# Patient Record
Sex: Male | Born: 1965 | Race: White | Hispanic: Yes | Marital: Single | Smoking: Never smoker
Health system: Southern US, Community
[De-identification: ages and names within clinical notes are randomized; demographics above are authoritative.]

## PROBLEM LIST (undated history)

## (undated) DIAGNOSIS — F329 Major depressive disorder, single episode, unspecified: Secondary | ICD-10-CM

## (undated) DIAGNOSIS — F32A Depression, unspecified: Secondary | ICD-10-CM

## (undated) DIAGNOSIS — E7800 Pure hypercholesterolemia, unspecified: Secondary | ICD-10-CM

---

## 2005-07-31 ENCOUNTER — Emergency Department (HOSPITAL_COMMUNITY): Admission: EM | Admit: 2005-07-31 | Discharge: 2005-07-31 | Payer: Self-pay | Admitting: Emergency Medicine

## 2005-08-03 ENCOUNTER — Emergency Department (HOSPITAL_COMMUNITY): Admission: EM | Admit: 2005-08-03 | Discharge: 2005-08-03 | Payer: Self-pay | Admitting: Emergency Medicine

## 2005-08-16 ENCOUNTER — Emergency Department (HOSPITAL_COMMUNITY): Admission: EM | Admit: 2005-08-16 | Discharge: 2005-08-16 | Payer: Self-pay | Admitting: Emergency Medicine

## 2012-04-19 ENCOUNTER — Emergency Department (HOSPITAL_COMMUNITY)
Admission: EM | Admit: 2012-04-19 | Discharge: 2012-04-19 | Disposition: A | Discharge status: Home / Self Care | Payer: Worker's Compensation | Attending: Emergency Medicine | Admitting: Emergency Medicine

## 2012-04-19 DIAGNOSIS — M545 Low back pain, unspecified: Secondary | ICD-10-CM | POA: Insufficient documentation

## 2012-04-19 DIAGNOSIS — R209 Unspecified disturbances of skin sensation: Secondary | ICD-10-CM | POA: Insufficient documentation

## 2012-04-19 DIAGNOSIS — X503XXA Overexertion from repetitive movements, initial encounter: Secondary | ICD-10-CM | POA: Insufficient documentation

## 2012-04-19 DIAGNOSIS — Y990 Civilian activity done for income or pay: Secondary | ICD-10-CM | POA: Insufficient documentation

## 2012-04-19 DIAGNOSIS — Y9269 Other specified industrial and construction area as the place of occurrence of the external cause: Secondary | ICD-10-CM | POA: Insufficient documentation

## 2012-04-19 DIAGNOSIS — Y93K9 Activity, other involving animal care: Secondary | ICD-10-CM | POA: Insufficient documentation

## 2012-04-19 MED ORDER — HYDROCODONE-ACETAMINOPHEN 5-325 MG PO TABS: ORAL_TABLET | ORAL | Status: AC

## 2012-04-19 MED ORDER — IBUPROFEN 600 MG PO TABS: 600.0000 mg | ORAL_TABLET | Freq: Four times a day (QID) | ORAL | Status: AC | PRN

## 2012-04-19 MED ORDER — METHOCARBAMOL 500 MG PO TABS: 1000.0000 mg | ORAL_TABLET | Freq: Four times a day (QID) | ORAL | Status: AC

## 2012-04-19 NOTE — ED Notes (Signed)
Pt states he has lower back pain from a prior injury. Pt states he was moving a 90 lb dog at work and injured his back awhile ago and pain is getting worse. Pt states he has taken Advil for pain, but it is not helping and it's hurting his stomach. Pt also states pain is in L buttock and radiating down L leg. States he feels numb in L leg. Pt ambulatory to exam room with steady gait. Pt states he drove himself here.

## 2012-04-19 NOTE — ED Provider Notes (Signed)
History    This chart was scribed for non-physician practitioner working with Ward Givens, MD by Leone Payor, ED Scribe. This patient was seen in room WTR6/WTR6 and the patient's care was started at 2001.   CSN: [id number]  Arrival date & time 04/19/12  2001   First MD Initiated Contact with Patient 04/19/12 2016      Chief Complaint  Patient presents with  . Back Pain    The history is provided by the patient. No language interpreter was used.    Jose Cantu is a 47 y.o. male who presents to the Emergency Department complaining of ongoing, constant, gradually worsening low back pain that radiates to left buttocks starting 1-2 weeks ago. Pt describes the discomfort as numbness and rates the pain as 5/10 to 7/10. States the pain is worse with walking. 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 He has had pain since that incident. He reports taking Advil but states it is causing irriation in his stomach and while urinating. Pt denies having h/o back problems. He denies fever, sudden weight change, IV drug use, h/o cancer, loss of bowel or bladder function.    PCP [facility] in [place].   No significant past medical history.  No significant past surgical history.  No family history on file.  History  Substance Use Topics  . Smoking status: Not on file  . Smokeless tobacco: Not on file  . Alcohol Use: Not on file      Review of Systems  Constitutional: Negative for fever and unexpected weight change.  Gastrointestinal: Negative for constipation.       Neg for fecal incontinence  Genitourinary: Negative for hematuria, flank pain and difficulty urinating.       Negative for urinary incontinence or retention  Musculoskeletal: Positive for back pain.  Neurological: Negative for weakness and numbness.       Negative for saddle paresthesias     Allergies  Review of patient's allergies indicates not on file.  Home  Medications   Current Outpatient Rx  Name  Route  Sig  Dispense  Refill  . HYDROcodone-acetaminophen (NORCO/VICODIN) 5-325 MG per tablet      Take 1-2 tablets every 6 hours as needed for severe pain   12 tablet   0   . ibuprofen (ADVIL,MOTRIN) 600 MG tablet   Oral   Take 1 tablet (600 mg total) by mouth every 6 (six) hours as needed for pain.   20 tablet   0   . methocarbamol (ROBAXIN) 500 MG tablet   Oral   Take 2 tablets (1,000 mg total) by mouth 4 (four) times daily.   20 tablet   0     BP 127/77  Pulse 82  Temp(Src) 98.4 F (36.9 C) (Oral)  Resp 16  SpO2 97%  Physical Exam  Nursing note and vitals reviewed. Constitutional: He appears well-developed and well-nourished. No distress.  HENT:  Head: Normocephalic and atraumatic.  Eyes: Conjunctivae and EOM are normal.  Neck: Normal range of motion. Neck supple. No tracheal deviation present.  Cardiovascular: Normal rate.   Pulmonary/Chest: Effort normal. No respiratory distress.  Abdominal: Soft. There is no tenderness. There is no CVA tenderness.  Musculoskeletal: Normal range of motion. He exhibits no tenderness.  Lumbar paraspinal tenderness. Trace weakness of LLE in the hip, of the knee, ankle. Decreased sensation on medial left thigh.   Neurological: He is alert. He has  normal reflexes. No sensory deficit. He exhibits normal muscle tone.  Skin: Skin is warm and dry.  Psychiatric: He has a normal mood and affect. His behavior is normal.    ED Course  Procedures (including critical care time)  DIAGNOSTIC STUDIES: Oxygen Saturation is 97% on room air, adequate by my interpretation.    COORDINATION OF CARE: 8:24 PM Discussed treatment plan with pt at bedside and pt agreed to plan.    Labs Reviewed - No data to display No results found.   1. Low back pain     Patient seen and examined.  Vital signs reviewed and are as follows: Filed Vitals:   04/19/12 2025  BP: 127/77  Pulse: 82  Temp: 98.4 F  (36.9 C)  Resp: 16    No red flag s/s of low back pain. Patient was counseled on back pain precautions and told to do activity as tolerated but do not lift, push, or pull heavy objects more than 10 pounds for the next week.  Patient counseled to use ice or heat on back for no longer than 15 minutes every hour.   Patient prescribed muscle relaxer and counseled on proper use of muscle relaxant medication.    Patient prescribed narcotic pain medicine and counseled on proper use of narcotic pain medications. Counseled not to combine this medication with others containing tylenol.   Urged patient not to drink alcohol, drive, or perform any other activities that requires focus while taking either of these medications.  Patient urged to follow-up with PCP if pain does not improve with treatment and rest or if pain becomes recurrent. Urged to return with worsening severe pain, loss of bowel or bladder control, trouble walking.   The patient verbalizes understanding and agrees with the plan.     MDM  Patient with back pain. Slight weakness in left lower extremity. Suspect patient has radicular symptoms. Patient is ambulatory. No warning symptoms of back pain including: loss of bowel or bladder control, night sweats, waking from sleep with back pain, unexplained fevers or weight loss, h/o cancer, IVDU, recent trauma. No concern for cauda equina, epidural abscess, or other serious cause of back pain. No indication for emergent imaging or neurosurgical consultation. Conservative measures such as rest, ice/heat and pain medicine indicated with PCP follow-up if no improvement with conservative management.   I personally performed the services described in this documentation, which was scribed in my presence. The recorded information has been reviewed and is accurate.       Renne Crigler, PA-C 04/20/12 0045

## 2012-04-22 NOTE — ED Provider Notes (Signed)
Medical screening examination/treatment/procedure(s) were performed by non-physician practitioner and as supervising physician I was immediately available for consultation/collaboration. Taraji Mungo, MD, FACEP   Jshon Ibe L Teghan Philbin, MD 04/22/12 0859 

## 2015-09-20 ENCOUNTER — Emergency Department (HOSPITAL_BASED_OUTPATIENT_CLINIC_OR_DEPARTMENT_OTHER)
Admission: EM | Admit: 2015-09-20 | Discharge: 2015-09-20 | Disposition: A | Discharge status: Home / Self Care | Payer: Worker's Compensation | Attending: Emergency Medicine | Admitting: Emergency Medicine

## 2015-09-20 ENCOUNTER — Encounter (HOSPITAL_BASED_OUTPATIENT_CLINIC_OR_DEPARTMENT_OTHER): Payer: Self-pay | Admitting: Emergency Medicine

## 2015-09-20 DIAGNOSIS — Y929 Unspecified place or not applicable: Secondary | ICD-10-CM | POA: Insufficient documentation

## 2015-09-20 DIAGNOSIS — S0990XA Unspecified injury of head, initial encounter: Secondary | ICD-10-CM

## 2015-09-20 DIAGNOSIS — Y990 Civilian activity done for income or pay: Secondary | ICD-10-CM | POA: Diagnosis not present

## 2015-09-20 DIAGNOSIS — Y939 Activity, unspecified: Secondary | ICD-10-CM | POA: Diagnosis not present

## 2015-09-20 DIAGNOSIS — S0101XA Laceration without foreign body of scalp, initial encounter: Secondary | ICD-10-CM | POA: Insufficient documentation

## 2015-09-20 DIAGNOSIS — W228XXA Striking against or struck by other objects, initial encounter: Secondary | ICD-10-CM | POA: Diagnosis not present

## 2015-09-20 DIAGNOSIS — Z23 Encounter for immunization: Secondary | ICD-10-CM | POA: Insufficient documentation

## 2015-09-20 DIAGNOSIS — Z79899 Other long term (current) drug therapy: Secondary | ICD-10-CM | POA: Insufficient documentation

## 2015-09-20 HISTORY — DX: Depression, unspecified: F32.A

## 2015-09-20 HISTORY — DX: Pure hypercholesterolemia, unspecified: E78.00

## 2015-09-20 HISTORY — DX: Major depressive disorder, single episode, unspecified: F32.9

## 2015-09-20 MED ORDER — TETANUS-DIPHTH-ACELL PERTUSSIS 5-2.5-18.5 LF-MCG/0.5 IM SUSP
0.5000 mL | Freq: Once | INTRAMUSCULAR | Status: AC
  Administered 2015-09-20: 0.5 mL via INTRAMUSCULAR
  Filled 2015-09-20: qty 0.5
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
## 2015-11-10 ENCOUNTER — Emergency Department (HOSPITAL_COMMUNITY)
Admission: EM | Admit: 2015-11-10 | Discharge: 2015-11-10 | Disposition: A | Discharge status: Home / Self Care | Payer: BLUE CROSS/BLUE SHIELD | Attending: Emergency Medicine | Admitting: Emergency Medicine

## 2015-11-10 ENCOUNTER — Emergency Department (HOSPITAL_COMMUNITY): Payer: BLUE CROSS/BLUE SHIELD

## 2015-11-10 DIAGNOSIS — Y999 Unspecified external cause status: Secondary | ICD-10-CM | POA: Diagnosis not present

## 2015-11-10 DIAGNOSIS — S29021A Laceration of muscle and tendon of front wall of thorax, initial encounter: Secondary | ICD-10-CM | POA: Insufficient documentation

## 2015-11-10 DIAGNOSIS — Y939 Activity, unspecified: Secondary | ICD-10-CM | POA: Insufficient documentation

## 2015-11-10 DIAGNOSIS — T148XXA Other injury of unspecified body region, initial encounter: Secondary | ICD-10-CM

## 2015-11-10 DIAGNOSIS — Y9289 Other specified places as the place of occurrence of the external cause: Secondary | ICD-10-CM | POA: Insufficient documentation

## 2015-11-10 DIAGNOSIS — S21111A Laceration without foreign body of right front wall of thorax without penetration into thoracic cavity, initial encounter: Secondary | ICD-10-CM

## 2015-11-10 LAB — PREPARE FRESH FROZEN PLASMA
UNIT DIVISION: 0
Unit division: 0

## 2015-11-10 LAB — ABO/RH: ABO/RH(D): O POS

## 2015-11-10 LAB — I-STAT CHEM 8, ED
BUN: 22 mg/dL — ABNORMAL HIGH (ref 6–20)
Calcium, Ion: 1.09 mmol/L — ABNORMAL LOW (ref 1.15–1.40)
Chloride: 106 mmol/L (ref 101–111)
Creatinine, Ser: 0.9 mg/dL (ref 0.61–1.24)
GLUCOSE: 142 mg/dL — AB (ref 65–99)
HEMATOCRIT: 44 % (ref 39.0–52.0)
HEMOGLOBIN: 15 g/dL (ref 13.0–17.0)
POTASSIUM: 3.7 mmol/L (ref 3.5–5.1)
Sodium: 139 mmol/L (ref 135–145)
TCO2: 24 mmol/L (ref 0–100)

## 2015-11-10 LAB — I-STAT TROPONIN, ED: Troponin i, poc: 0 ng/mL (ref 0.00–0.08)

## 2015-11-10 LAB — I-STAT CG4 LACTIC ACID, ED: LACTIC ACID, VENOUS: 2.59 mmol/L — AB (ref 0.5–1.9)

## 2015-11-10 MED ORDER — LIDOCAINE-EPINEPHRINE 1 %-1:100000 IJ SOLN
20.0000 mL | Freq: Once | INTRAMUSCULAR | Status: AC
  Administered 2015-11-10: 20 mL via INTRADERMAL
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
## 2015-11-10 NOTE — ED Notes (Signed)
Dr. Mayford KnifeWilliams at bedside to suture laceration
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
## 2015-11-11 LAB — TYPE AND SCREEN
ABO/RH(D): O POS
ANTIBODY SCREEN: NEGATIVE
Unit division: 0
Unit division: 0

## 2016-10-02 ENCOUNTER — Emergency Department (HOSPITAL_BASED_OUTPATIENT_CLINIC_OR_DEPARTMENT_OTHER)
Admission: EM | Admit: 2016-10-02 | Discharge: 2016-10-02 | Disposition: A | Discharge status: Home / Self Care | Payer: Worker's Compensation | Attending: Emergency Medicine | Admitting: Emergency Medicine

## 2016-10-02 ENCOUNTER — Encounter (HOSPITAL_BASED_OUTPATIENT_CLINIC_OR_DEPARTMENT_OTHER): Payer: Self-pay | Admitting: Emergency Medicine

## 2016-10-02 DIAGNOSIS — X501XXA Overexertion from prolonged static or awkward postures, initial encounter: Secondary | ICD-10-CM | POA: Diagnosis not present

## 2016-10-02 DIAGNOSIS — Y9259 Other trade areas as the place of occurrence of the external cause: Secondary | ICD-10-CM | POA: Diagnosis not present

## 2016-10-02 DIAGNOSIS — S39012A Strain of muscle, fascia and tendon of lower back, initial encounter: Secondary | ICD-10-CM | POA: Diagnosis not present

## 2016-10-02 DIAGNOSIS — Y999 Unspecified external cause status: Secondary | ICD-10-CM | POA: Insufficient documentation

## 2016-10-02 DIAGNOSIS — Y93K9 Activity, other involving animal care: Secondary | ICD-10-CM | POA: Diagnosis not present

## 2016-10-02 DIAGNOSIS — Z79899 Other long term (current) drug therapy: Secondary | ICD-10-CM | POA: Insufficient documentation

## 2016-10-02 DIAGNOSIS — M545 Low back pain: Secondary | ICD-10-CM | POA: Diagnosis present

## 2016-10-02 MED ORDER — ORPHENADRINE CITRATE ER 100 MG PO TB12: 100.0000 mg | ORAL_TABLET | Freq: Two times a day (BID) | ORAL | 0 refills | Status: DC

## 2016-10-02 MED ORDER — PREDNISONE 20 MG PO TABS: ORAL_TABLET | ORAL | 0 refills | Status: DC

## 2016-10-02 MED ORDER — TRAMADOL HCL 50 MG PO TABS: 100.0000 mg | ORAL_TABLET | Freq: Four times a day (QID) | ORAL | 0 refills | Status: DC | PRN

## 2016-10-02 NOTE — ED Triage Notes (Signed)
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 Reports that pain is to his lower back and radiates down his leg

## 2016-10-02 NOTE — ED Notes (Signed)
Pt educated about not driving or performing critical tasks (such as operating heavy machinery or caring for an infant/toddler/child) while taking Ultram/Norflex. Educated about not mixing medications with alcohol and/or other sedatives (such as narcotics, Benadryl). Pt/caregiver verbalized understanding.

## 2016-10-02 NOTE — ED Provider Notes (Signed)
[facility] Provider Note   CSN: [id number] Arrival date & time: 10/02/16  1446     History   Chief Complaint Chief Complaint  Patient presents with  . Back Pain    HPI Jose Cantu is a 51 y.o. male.  HPI Patient has had problems with back strain in the past. 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 The dog lunged causing him to have to strain backwards to support the animal. He felt a pull and sharp pain in his left mid to lower back. He reports now he gets a pain that radiates from the upper lumbar area to the buttocks on the left. He reports it causes some slight discomfort into his leg. He is able to walk with a mild limp. No numbness or tingling of the leg. No associated abdominal pain. Patient reports he has similar episode about 2 years ago. He reports he does have a distant history of ulcers and is not supposed to take naproxen. He used to be on a PPI but has not been taking one for a while. No history of diabetes. Past Medical History:  Diagnosis Date  . Depression   . Hypercholesterolemia     There are no active problems to display for this patient.   History reviewed. No pertinent surgical history.     Home Medications    Prior to Admission medications   Medication Sig Start Date End Date Taking? Authorizing Provider  busPIRone (BUSPAR) 10 MG tablet Take 10 mg by mouth 3 (three) times daily.    [provider]  citalopram (CELEXA) 10 MG tablet Take 10 mg by mouth daily.    [provider]  HYDROcodone-acetaminophen (NORCO/VICODIN) 5-325 MG per tablet Take 1-2 tablets every 6 hours as needed for severe pain 04/19/12   Renne Crigler, PA-C  ibuprofen (ADVIL,MOTRIN) 600 MG tablet Take 1 tablet (600 mg total) by mouth every 6 (six) hours as needed for pain. 04/19/12   Renne Crigler, PA-C  methocarbamol (ROBAXIN) 500 MG tablet Take 2 tablets (1,000 mg total) by mouth 4 (four) times daily. 04/19/12    Renne Crigler, PA-C  orphenadrine (NORFLEX) 100 MG tablet Take 1 tablet (100 mg total) by mouth 2 (two) times daily. 10/02/16   Arby Barrette, MD  predniSONE (DELTASONE) 20 MG tablet 3 tabs po day one, then 2 tabs daily x 4 days 10/02/16   Arby Barrette, MD  simvastatin (ZOCOR) 20 MG tablet Take 20 mg by mouth daily.    [provider]  traMADol (ULTRAM) 50 MG tablet Take 2 tablets (100 mg total) by mouth every 6 (six) hours as needed. 10/02/16   Arby Barrette, MD    Family History History reviewed. No pertinent family history.  Social History Social History  Substance Use Topics  . Smoking status: Never Smoker  . Smokeless tobacco: Never Used  . Alcohol use No     Allergies   Patient has no known allergies.   Review of Systems Review of Systems 10 Systems reviewed and are negative for acute change except as noted in the HPI.   Physical Exam Updated Vital Signs BP 113/87 (BP Location: Right Arm)   Pulse 76   Resp 18   Ht  (1.651 m)   Wt 90.7 kg (200 lb)   SpO2 96%   BMI 33.28 kg/m   Physical Exam  Constitutional: He is oriented to person, place, and time. He appears well-developed and well-nourished.  Alert and nontoxic. Patient is standing and ambulating in the room. Moderate central obesity.  HENT:  Head: Normocephalic and atraumatic.  Eyes: Conjunctivae and EOM are normal.  Neck: Neck supple.  Cardiovascular: Normal rate and regular rhythm.   No murmur heard. Pulmonary/Chest: Effort normal and breath sounds normal. No respiratory distress.  Musculoskeletal: Normal range of motion. He exhibits tenderness.  Patient has some reproducible tenderness in the left paraspinous lumbar back from approximately L2-3 down to the SI region. He is able to ambulate with normal gait. No lower extremity weakness.  Neurological: He is alert and oriented to person, place, and time. No cranial nerve deficit. He exhibits normal muscle tone. Coordination normal.  Skin:  Skin is warm and dry.  Psychiatric: He has a normal mood and affect.  Nursing note and vitals reviewed.    ED Treatments / Results  Labs (all labs ordered are listed, but only abnormal results are displayed) Labs Reviewed - No data to display  EKG  EKG Interpretation None       Radiology No results found.  Procedures Procedures (including critical care time)  Medications Ordered in ED Medications - No data to display   Initial Impression / Assessment and Plan / ED Course  I have reviewed the triage vital signs and the nursing notes.  Pertinent labs & imaging results that were available during my care of the patient were reviewed by me and considered in my medical decision making (see chart for details).     Final Clinical Impressions(s) / ED Diagnoses   Final diagnoses:  Lumbar strain, initial encounter   Patient had an injury at work today resulting in lumbar strain. There are some radicular symptoms with pain radiation to the buttock. No lower extremity weakness or dysfunction. Patient be started on a prednisone burst. Patient is counseled to resume his Prilosec while taking pain medications. He has not had an active ulcer and some time. Norflex and tramadol prescribed for pain control. New Prescriptions New Prescriptions   ORPHENADRINE (NORFLEX) 100 MG TABLET    Take 1 tablet (100 mg total) by mouth 2 (two) times daily.   PREDNISONE (DELTASONE) 20 MG TABLET    3 tabs po day one, then 2 tabs daily x 4 days   TRAMADOL (ULTRAM) 50 MG TABLET    Take 2 tablets (100 mg total) by mouth every 6 (six) hours as needed.     Arby BarrettePfeiffer, Mandisa Persinger, MD 10/02/16 1550

## 2016-10-02 NOTE — ED Notes (Signed)
Pt being moved to triage room for drug screen process.

## 2016-10-20 ENCOUNTER — Encounter (HOSPITAL_BASED_OUTPATIENT_CLINIC_OR_DEPARTMENT_OTHER): Payer: Self-pay | Admitting: Emergency Medicine

## 2016-10-20 ENCOUNTER — Emergency Department (HOSPITAL_BASED_OUTPATIENT_CLINIC_OR_DEPARTMENT_OTHER): Payer: Worker's Compensation

## 2016-10-20 ENCOUNTER — Emergency Department (HOSPITAL_BASED_OUTPATIENT_CLINIC_OR_DEPARTMENT_OTHER)
Admission: EM | Admit: 2016-10-20 | Discharge: 2016-10-20 | Disposition: A | Discharge status: Home / Self Care | Payer: Worker's Compensation | Attending: Emergency Medicine | Admitting: Emergency Medicine

## 2016-10-20 DIAGNOSIS — M5442 Lumbago with sciatica, left side: Secondary | ICD-10-CM | POA: Diagnosis not present

## 2016-10-20 DIAGNOSIS — Z79899 Other long term (current) drug therapy: Secondary | ICD-10-CM | POA: Insufficient documentation

## 2016-10-20 DIAGNOSIS — R059 Cough, unspecified: Secondary | ICD-10-CM

## 2016-10-20 DIAGNOSIS — R05 Cough: Secondary | ICD-10-CM | POA: Diagnosis not present

## 2016-10-20 DIAGNOSIS — M545 Low back pain: Secondary | ICD-10-CM | POA: Diagnosis present

## 2016-10-20 DIAGNOSIS — J069 Acute upper respiratory infection, unspecified: Secondary | ICD-10-CM

## 2016-10-20 MED ORDER — PREDNISONE 20 MG PO TABS: ORAL_TABLET | ORAL | 0 refills | Status: AC

## 2016-10-20 MED ORDER — TRAMADOL HCL 50 MG PO TABS: 50.0000 mg | ORAL_TABLET | Freq: Four times a day (QID) | ORAL | 0 refills | Status: AC | PRN

## 2016-10-20 MED ORDER — ORPHENADRINE CITRATE ER 100 MG PO TB12: 100.0000 mg | ORAL_TABLET | Freq: Two times a day (BID) | ORAL | 0 refills | Status: AC

## 2016-10-20 NOTE — Discharge Instructions (Signed)
Medications: Norflex, prednisone, tramadol  Treatment: Take Norflex twice daily for muscle pain and spasms. Take 1-2 tramadol every 6 hours as prescribed, as needed for your pain. Do not drive or operate machinery while taking these medications as they can make you very sleepy. Take prednisone as prescribed for 4 days. You can alternate with Tylenol as prescribed. Use a heating pad 3-4 times daily alternating 20 mintues on, 20 minutes off. Attempt the back exercises and strecthes 1-2 times daily as tolerated.  Follow-up: Please follow up with your primary care provider for further evaluation and treatment of your symptoms. Please return to the emergency department if you develop any new or worsening symptoms, including complete numbness of your legs or groin area, loss of bowel or bladder control, inability to walk, or any other new or concerning symptom.

## 2016-10-20 NOTE — ED Provider Notes (Signed)
[facility]   CSN: [id number] Arrival date & time: 10/20/16  1754     History   Chief Complaint Chief Complaint  Patient presents with  . Back Pain    HPI Jose Cantu is a 51 y.o. male with history of low back pain who presents with back pain and cough. 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 He got better, but after a busy day 4 days ago, he began having a worsening pain in his low back radiating to the back of his left leg. Patient also reports a four-day history of cough with associated chest pain and shortness of breath after coughing. Patient has also had a few episodes of posttussive emesis. He also reports a subjective fever. He reports several people at his job has been sick. He has not taken any medications at home for his symptoms. He does feel like his cough has been improving and this is the first day he has been able to leave the house. He denies any abdominal pain, saddle anesthesia, numbness or tingling in his legs, loss of bowel or bladder control, history of procedure to back, or IVDU.   HPI  Past Medical History:  Diagnosis Date  . Depression   . Hypercholesterolemia     There are no active problems to display for this patient.   History reviewed. No pertinent surgical history.     Home Medications    Prior to Admission medications   Medication Sig Start Date End Date Taking? Authorizing Provider  busPIRone (BUSPAR) 10 MG tablet Take 10 mg by mouth 3 (three) times daily.    [provider]  citalopram (CELEXA) 10 MG tablet Take 10 mg by mouth daily.    [provider]  HYDROcodone-acetaminophen (NORCO/VICODIN) 5-325 MG per tablet Take 1-2 tablets every 6 hours as needed for severe pain 04/19/12   Renne Crigler, PA-C  ibuprofen (ADVIL,MOTRIN) 600 MG tablet Take 1 tablet (600 mg total) by mouth every 6 (six) hours as needed for pain. 04/19/12    Renne Crigler, PA-C  methocarbamol (ROBAXIN) 500 MG tablet Take 2 tablets (1,000 mg total) by mouth 4 (four) times daily. 04/19/12   Renne Crigler, PA-C  orphenadrine (NORFLEX) 100 MG tablet Take 1 tablet (100 mg total) by mouth 2 (two) times daily. 10/20/16   Ehtan Delfavero, Waylan Boga, PA-C  predniSONE (DELTASONE) 20 MG tablet 3 tabs po day one, then 2 tabs daily x 4 days 10/20/16   Emi Holes, PA-C  simvastatin (ZOCOR) 20 MG tablet Take 20 mg by mouth daily.    [provider]  traMADol (ULTRAM) 50 MG tablet Take 1 tablet (50 mg total) by mouth every 6 (six) hours as needed. 10/20/16   Emi Holes, PA-C    Family History No family history on file.  Social History Social History  Substance Use Topics  . Smoking status: Never Smoker  . Smokeless tobacco: Never Used  . Alcohol use No     Allergies   Patient has no known allergies.   Review of Systems Review of Systems  Constitutional: Positive for fever.  Respiratory: Positive for cough and shortness of breath (after coughing).   Cardiovascular: Positive for chest pain (with coughing).  Gastrointestinal: Positive for vomiting (post tussive).  Musculoskeletal: Positive for back pain. Negative for neck pain.  Neurological: Negative for numbness.     Physical Exam Updated Vital Signs BP 121/76 (  BP Location: Right Arm)   Pulse 89   Temp 98.3 F (36.8 C) (Oral)   Resp 16   Ht  (1.626 m)   Wt 93 kg (205 lb)   SpO2 99%   BMI 35.19 kg/m   Physical Exam  Constitutional: He appears well-developed and well-nourished. No distress.  HENT:  Head: Normocephalic and atraumatic.  Mouth/Throat: Oropharynx is clear and moist. No oropharyngeal exudate.  Eyes: Pupils are equal, round, and reactive to light. Conjunctivae are normal. Right eye exhibits no discharge. Left eye exhibits no discharge. No scleral icterus.  Neck: Normal range of motion. Neck supple. No thyromegaly present.  Cardiovascular: Normal rate, regular  rhythm, normal heart sounds and intact distal pulses.  Exam reveals no gallop and no friction rub.   No murmur heard. Pulmonary/Chest: Effort normal. No stridor. No respiratory distress. He has decreased breath sounds. He has no wheezes. He has no rales.  Musculoskeletal: He exhibits no edema.       Back:  Midline tenderness to lumbar spine; no midline tenderness to the cervical or thoracic spine  Lymphadenopathy:    He has no cervical adenopathy.  Neurological: He is alert. Coordination normal.  Skin: Skin is warm and dry. No rash noted. He is not diaphoretic. No pallor.  Psychiatric: He has a normal mood and affect.  Nursing note and vitals reviewed.    ED Treatments / Results  Labs (all labs ordered are listed, but only abnormal results are displayed) Labs Reviewed - No data to display  EKG  EKG Interpretation None       Radiology Dg Chest 2 View  Result Date: 10/20/2016 CLINICAL DATA:  Low back pain three days.  No injury. EXAM: CHEST  2 VIEW COMPARISON:  None. FINDINGS: Lungs are hypoinflated without focal consolidation or effusion. Mild cardiomegaly. Remaining bones and soft tissues are within normal. IMPRESSION: Hypoinflation without acute cardiopulmonary disease. Mild cardiomegaly. Electronically Signed   By: Elberta Fortis M.D.   On: 10/20/2016 19:04   Dg Lumbar Spine Complete  Result Date: 10/20/2016 CLINICAL DATA:  Low back pain 3 days. EXAM: LUMBAR SPINE - COMPLETE 4+ VIEW COMPARISON:  None. FINDINGS: Vertebral body alignment and heights are normal. There is mild spondylosis throughout the lumbar spine to include facet arthropathy over the lower lumbar spine. Subtle disc space narrowing at the L4-5 level. No compression fracture or subluxation. IMPRESSION: Mild spondylosis of the lumbar spine with minimal disc disease at the L4-5 level. Electronically Signed   By: Elberta Fortis M.D.   On: 10/20/2016 19:06    Procedures Procedures (including critical care  time)  Medications Ordered in ED Medications - No data to display   Initial Impression / Assessment and Plan / ED Course  I have reviewed the triage vital signs and the nursing notes.  Pertinent labs & imaging results that were available during my care of the patient were reviewed by me and considered in my medical decision making (see chart for details).     Patient with back pain.  No neurological deficits and normal neuro exam.  Patient is ambulatory.  No loss of bowel or bladder control.  No concern for cauda equina.  No fever, night sweats, weight loss, h/o cancer, IVDA, no recent procedure to back. No urinary symptoms suggestive of UTI.  Lumbar x-ray shows mild spondylosis of the lumbar spine with minimal disc disease at the L4-5 level. Patient also with URI symptoms improving. X-ray shows no acute cardiopulmonary disease. Supportive care and  return precaution discussed. Follow up with PCP for further evaluation. Patient understands and agrees to plan. We'll discharge home with short course of steroids, Norflex, tramadol. I reviewed the [place] narcotic database and found no discrepancies. Patient vitals stable throughout ED course and discharged in satisfactory condition.  Final Clinical Impressions(s) / ED Diagnoses   Final diagnoses:  Acute left-sided low back pain with left-sided sciatica  Upper respiratory tract infection, unspecified type  Cough    New Prescriptions Discharge Medication List as of 10/20/2016  7:25 PM       Emi Holes, PA-C 10/21/16 0105    Benjiman Core, MD 10/24/16 0710

## 2016-10-20 NOTE — ED Triage Notes (Signed)
Pt c/o lower back pain that returned 3 days ago w/o injury

## 2018-05-21 IMAGING — CR DG LUMBAR SPINE COMPLETE 4+V
5 series · 5 of 5 positions shown · non-contrast
Comparison: None.

CLINICAL DATA: Low back pain 3 days.

EXAM:
LUMBAR SPINE - COMPLETE 4+ VIEW

[t l-spine a.p.]
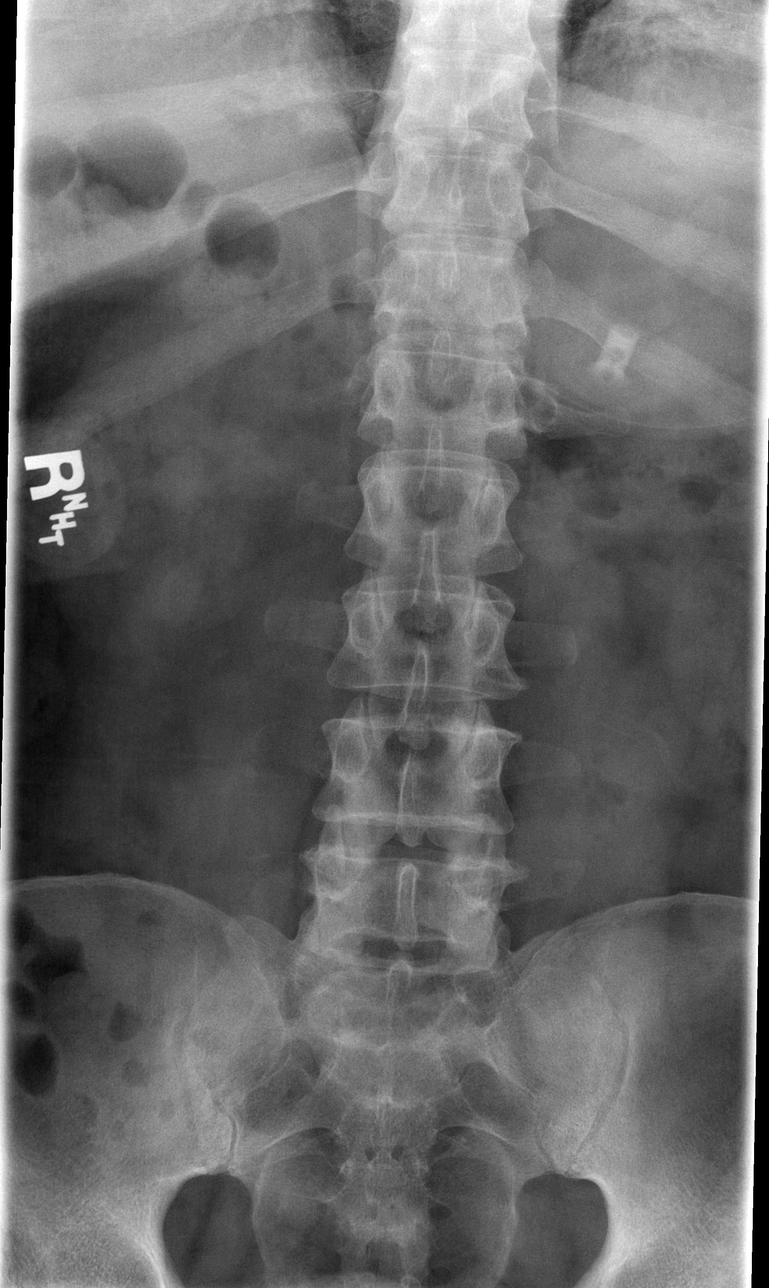

[t l-spine oblique exposure (1 of 2)]
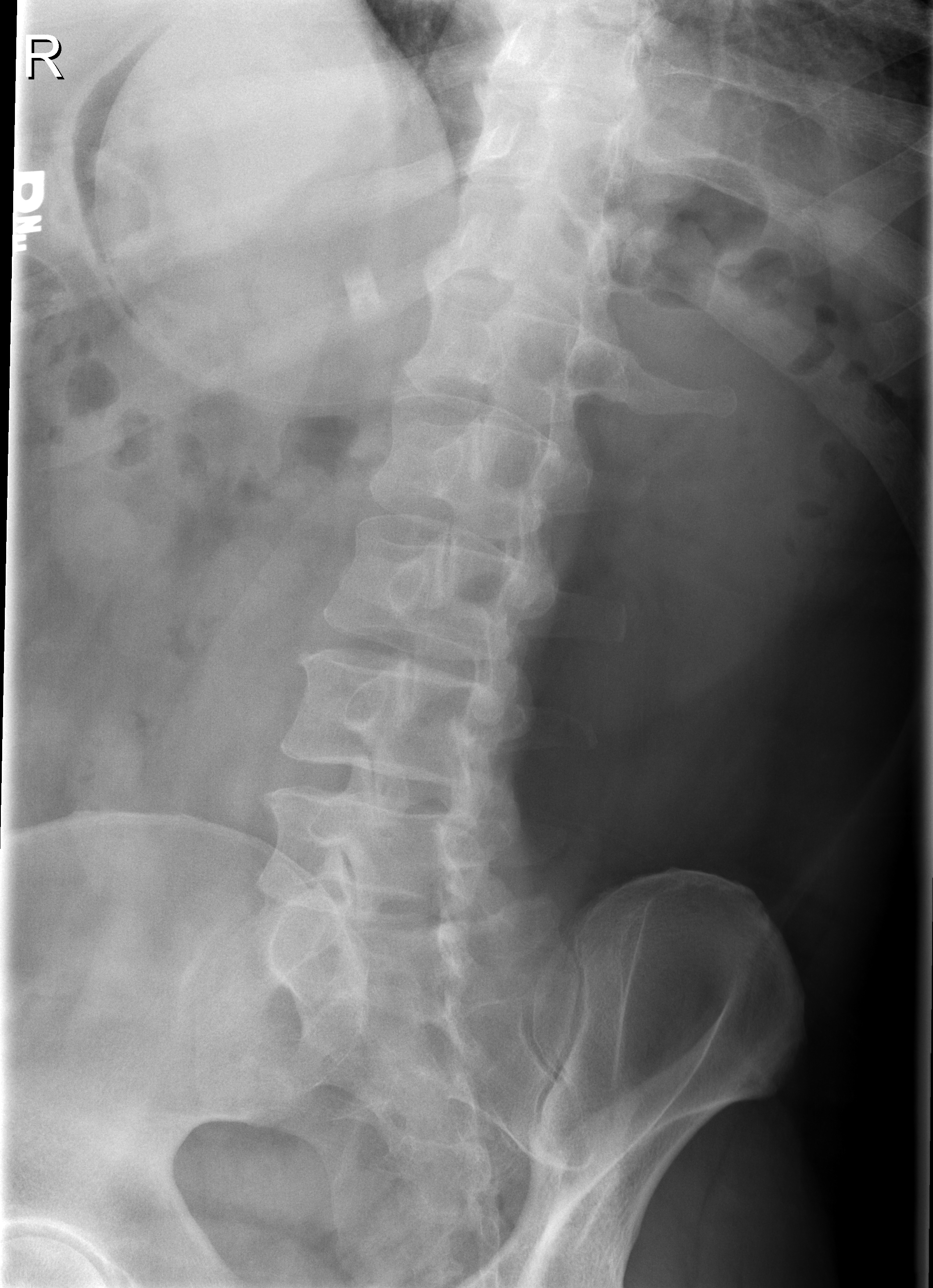

[t l-spine oblique exposure (2 of 2)]
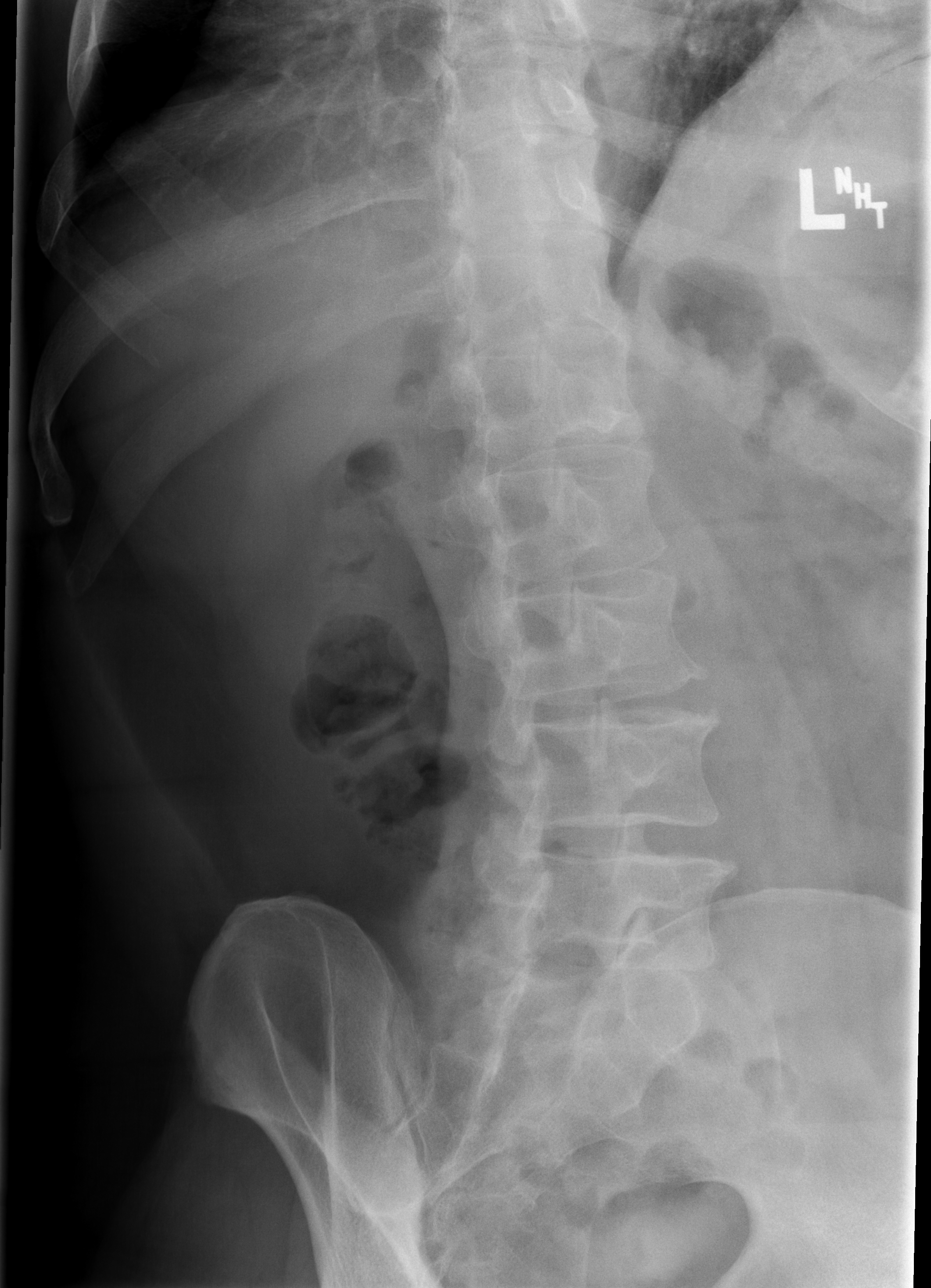

[t l-spine lat]
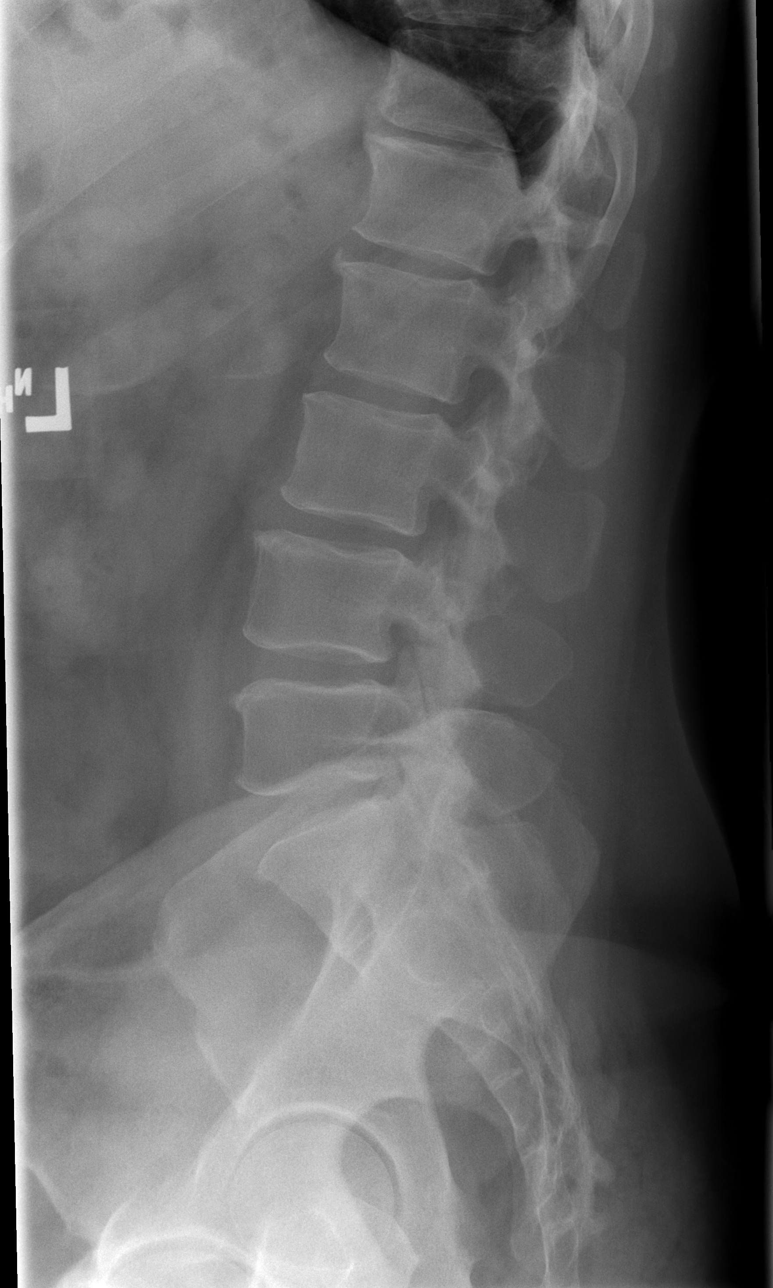

[t l-spine l5-s1 spot]
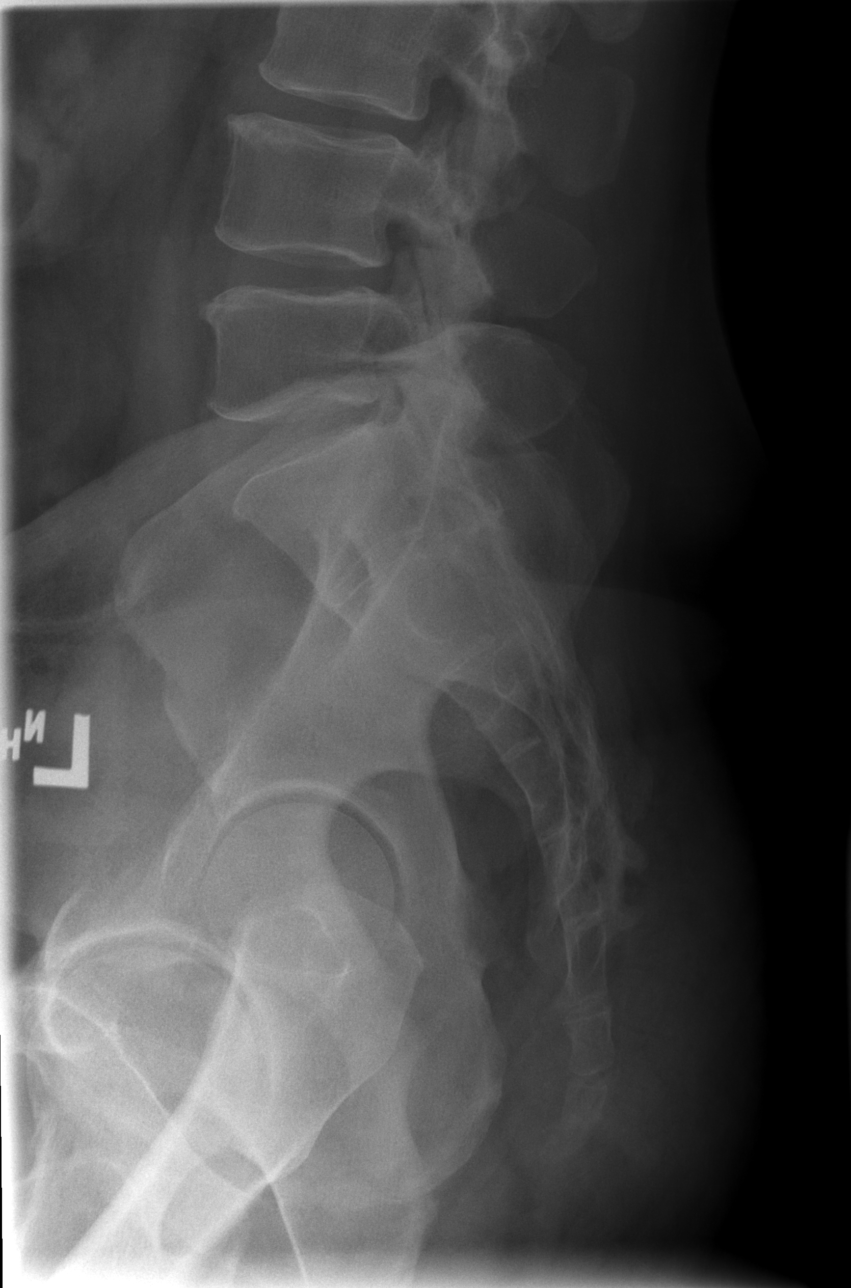

[5 of 5 positions shown; findings below may reference images not displayed]

FINDINGS: Vertebral body alignment and heights are normal. There is mild
spondylosis throughout the lumbar spine to include facet arthropathy
over the lower lumbar spine. Subtle disc space narrowing at the L4-5
level. No compression fracture or subluxation.
IMPRESSION: Mild spondylosis of the lumbar spine with minimal disc disease at
the L4-5 level.

## 2018-05-21 IMAGING — CR DG CHEST 2V
2 series · 2 of 2 positions shown · non-contrast
Comparison: None.

CLINICAL DATA: Low back pain three days.  No injury.

EXAM:
CHEST  2 VIEW

[w chest pa]
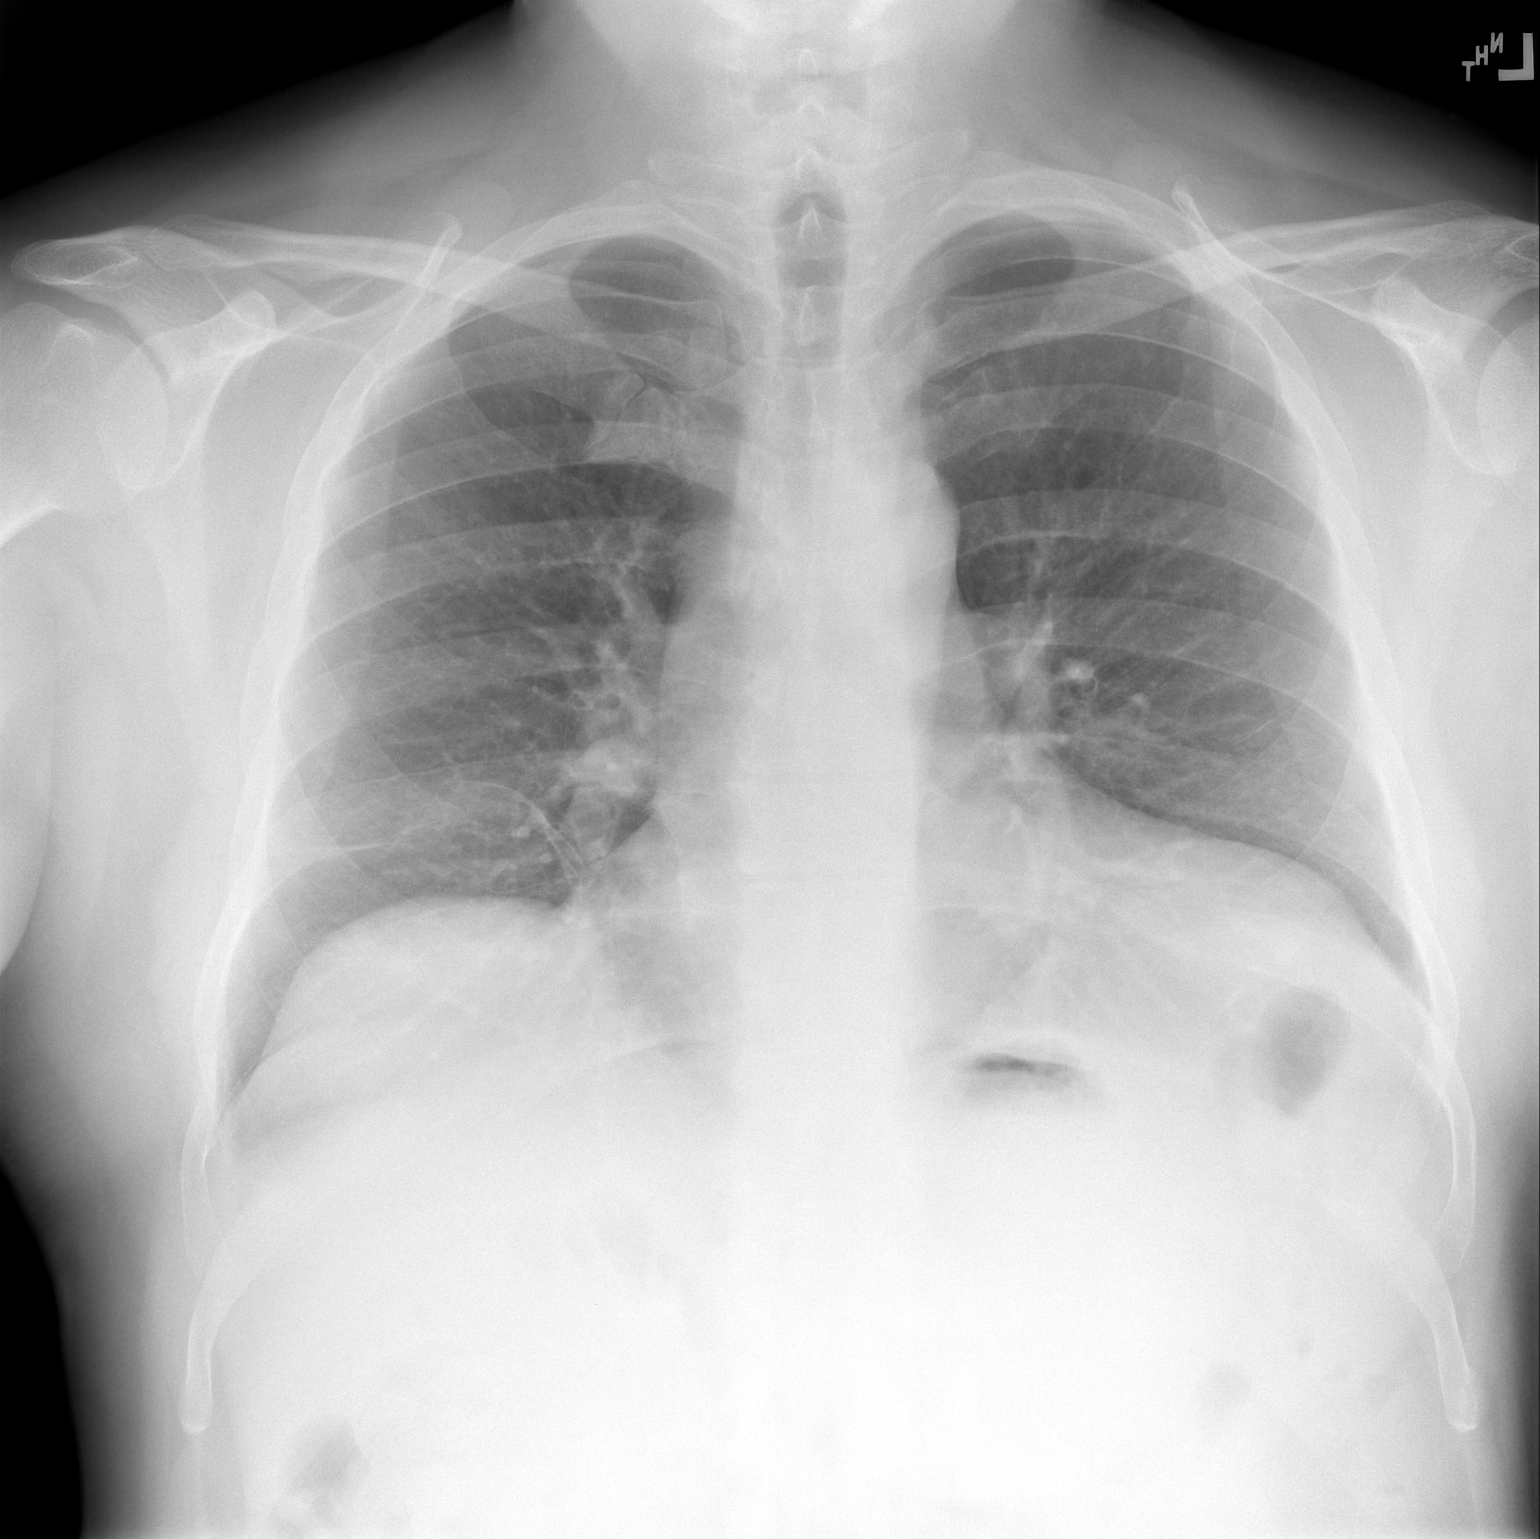

[w chest lat]
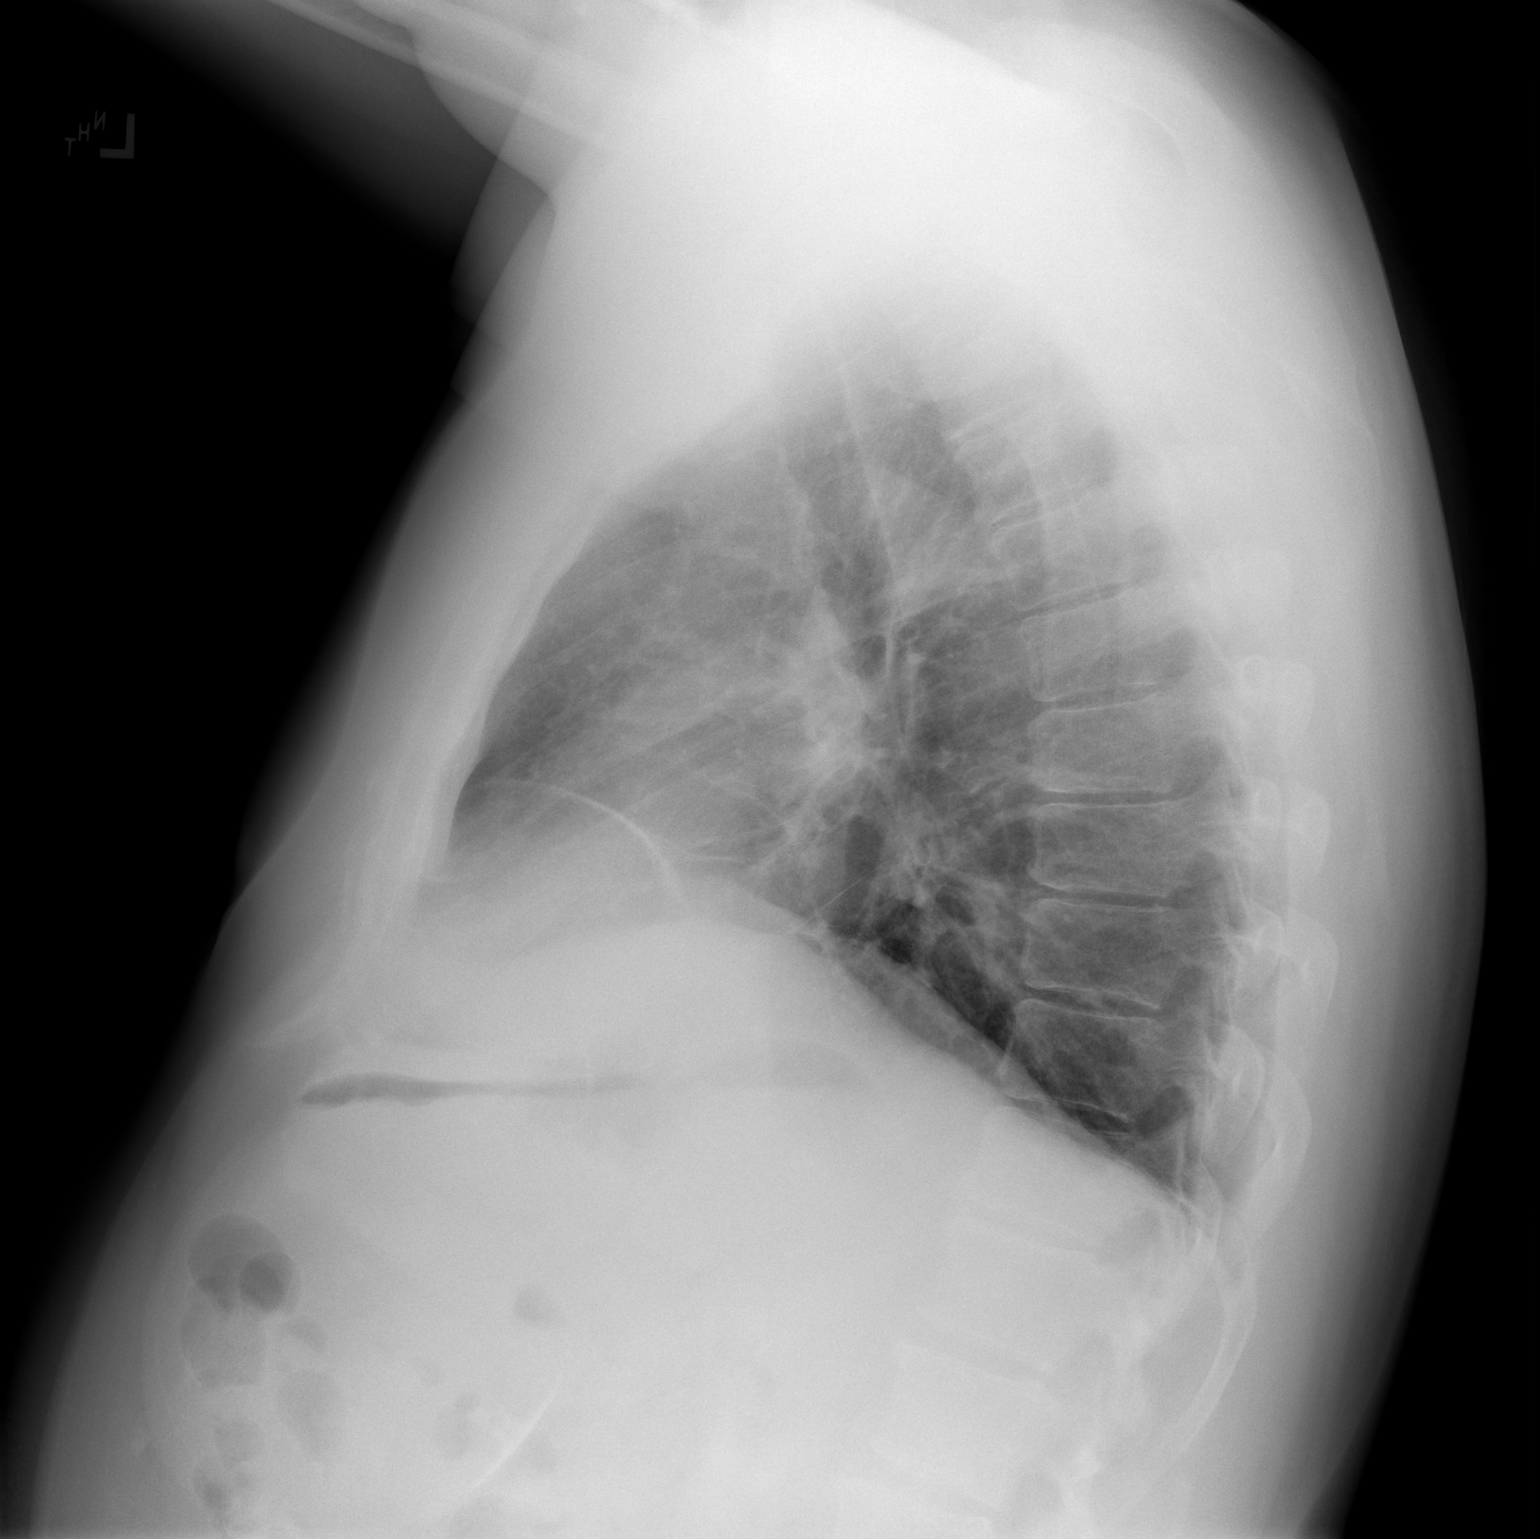

[2 of 2 positions shown; findings below may reference images not displayed]

FINDINGS: Lungs are hypoinflated without focal consolidation or effusion. Mild
cardiomegaly. Remaining bones and soft tissues are within normal.
IMPRESSION: Hypoinflation without acute cardiopulmonary disease.

Mild cardiomegaly.
